# Patient Record
Sex: Male | Born: 1995 | Race: Black or African American | Hispanic: No | Marital: Single | Smoking: Never smoker
Health system: Southern US, Community
[De-identification: ages and names within clinical notes are randomized; demographics above are authoritative.]

## PROBLEM LIST (undated history)

## (undated) DIAGNOSIS — F909 Attention-deficit hyperactivity disorder, unspecified type: Secondary | ICD-10-CM

## (undated) DIAGNOSIS — S6290XA Unspecified fracture of unspecified wrist and hand, initial encounter for closed fracture: Secondary | ICD-10-CM

---
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
## 2018-03-29 ENCOUNTER — Other Ambulatory Visit: Payer: Self-pay

## 2018-03-29 ENCOUNTER — Ambulatory Visit
Admission: EM | Admit: 2018-03-29 | Discharge: 2018-03-29 | Disposition: A | Discharge status: Home / Self Care | Payer: Self-pay | Attending: Emergency Medicine | Admitting: Emergency Medicine

## 2018-03-29 ENCOUNTER — Encounter: Payer: Self-pay | Admitting: Emergency Medicine

## 2018-03-29 DIAGNOSIS — J029 Acute pharyngitis, unspecified: Secondary | ICD-10-CM

## 2018-03-29 LAB — RAPID STREP SCREEN (MED CTR MEBANE ONLY): STREPTOCOCCUS, GROUP A SCREEN (DIRECT): NEGATIVE

## 2018-03-29 LAB — MONONUCLEOSIS SCREEN: MONO SCREEN: NEGATIVE

## 2018-03-29 MED ORDER — IBUPROFEN 600 MG PO TABS: 600.0000 mg | ORAL_TABLET | Freq: Four times a day (QID) | ORAL | 0 refills | Status: DC | PRN

## 2018-03-29 MED ORDER — DEXAMETHASONE SODIUM PHOSPHATE 10 MG/ML IJ SOLN
10.0000 mg | Freq: Once | INTRAMUSCULAR | Status: AC
  Administered 2018-03-29: 10 mg via INTRAMUSCULAR

## 2018-03-29 NOTE — ED Triage Notes (Signed)
Patient c/o sore throat x 1 week. 

## 2018-03-29 NOTE — ED Provider Notes (Signed)
HPI  SUBJECTIVE:  Patient reports sore throat starting a week ago. Sx worse in the morning and after  swallowing.  Sx better with nothing. Has been taking throat spray, salt water gargles, rest, Vicks VapoRub w/ o relief.  No Documented fevers, has not been checking. + Swollen neck glands   No neck stiffness  No Cough/URI sxs No Myalgias No Headache No Rash     No Recent Strep, mono exposure, no sick contacts with similar symptoms. No Abdominal Pain No reflux sxs No Allergy sxs   No Breathing difficulty, voice changes,  + sensation of throat swelling shut No Drooling No Trismus No abx in past month.  No antipyretic in past 4-6 h Past medical history of GERD.  No history of recurrent strep, mono, diabetes.   PMD: None.  Past Medical History:  Diagnosis Date  . ADHD (attention deficit hyperactivity disorder)   . Fracture of hand 2011    Past Surgical History:  Procedure Laterality Date  . CLOSED REDUCTION METACARPAL WITH PERCUTANEOUS PINNING Right 09/16/2015   Procedure: CLOSED REDUCTION METACARPAL WITH PERCUTANEOUS PINNING;  Surgeon: Christena Flake, MD;  Location: [facility] ORS;  Service: Orthopedics;  Laterality: Right;    Family History  Problem Relation Age of Onset  . Hypertension Mother   . Diabetes Mother   . Healthy Father     Social History   Tobacco Use  . Smoking status: Never Smoker  . Smokeless tobacco: Never Used  Substance Use Topics  . Alcohol use: No  . Drug use: Yes    Types: Marijuana    Comment: 2 weeks ago    No current facility-administered medications for this encounter.   Current Outpatient Medications:  .  ibuprofen (ADVIL,MOTRIN) 600 MG tablet, Take 1 tablet (600 mg total) by mouth every 6 (six) hours as needed., Disp: 30 tablet, Rfl: 0  No Known Allergies   ROS  As noted in HPI.   Physical Exam  BP 115/78 (BP Location: Left Arm)   Pulse 88   Temp 99.3 F (37.4 C) (Oral)   Resp 18   Ht 5\' 9"  (1.753 m)   Wt 175 lb (79.4  kg)   SpO2 97%   BMI 25.84 kg/m   Constitutional: Well developed, well nourished, no acute distress Eyes:  EOMI, conjunctiva normal bilaterally HENT: Normocephalic, atraumatic,mucus membranes moist. + mild nasal congestion +  erythematous oropharynx + enlarged tonsils + exudates. Uvula midline.  Respiratory: Normal inspiratory effort Cardiovascular: Normal rate, no murmurs, rubs, gallops GI: nondistended, nontender. No appreciable splenomegaly skin: No rash, skin intact Lymph: + Anterior cervical LN.  No posterior cervical lymphadenopathy Musculoskeletal: no deformities Neurologic: Alert & oriented x 3, no focal neuro deficits Psychiatric: Speech and behavior appropriate.   ED Course   Medications  dexamethasone (DECADRON) injection 10 mg (10 mg Intramuscular Given 03/29/18 1843)    Orders Placed This Encounter  Procedures  . Rapid Strep Screen ([facility] [place] ONLY)    Standing Status:   Standing    Number of Occurrences:   1    Order Specific Question:   Patient immune status    Answer:   Normal  . Culture, group A strep    Standing Status:   Standing    Number of Occurrences:   1  . Mononucleosis screen    Standing Status:   Standing    Number of Occurrences:   1    Results for orders placed or performed during the hospital encounter of 03/29/18 (  from the past 24 hour(s))  Rapid Strep Screen ([facility] [place] ONLY)     Status: None   Collection Time: 03/29/18  6:23 PM  Result Value Ref Range   Streptococcus, Group A Screen (Direct) NEGATIVE NEGATIVE  Mononucleosis screen     Status: None   Collection Time: 03/29/18  6:43 PM  Result Value Ref Range   Mono Screen NEGATIVE NEGATIVE   No results found.  ED Clinical Impression  Exudative pharyngitis   ED Assessment/Plan  Rapid strep, mono negative. Obtaining throat culture to guide antibiotic treatment.  Presentation most consistent with a viral pharyngitis.  Discussed this with patient. We'll contact them if  culture is positive, and will call in Appropriate antibiotics. Patient home with ibuprofen, Tylenol, Benadryl/Maalox mixture. Patient to followup with PMD of choice when necessary, will refer to local primary care resources.   Discussed labs,  MDM, plan and followup with patient. Discussed sn/sx that should prompt return to the ED. patient agrees with plan.   Meds ordered this encounter  Medications  . dexamethasone (DECADRON) injection 10 mg  . ibuprofen (ADVIL,MOTRIN) 600 MG tablet    Sig: Take 1 tablet (600 mg total) by mouth every 6 (six) hours as needed.    Dispense:  30 tablet    Refill:  0     *This clinic note was created using Scientist, clinical (histocompatibility and immunogenetics)Dragon dictation software. Therefore, there may be occasional mistakes despite careful proofreading.    Domenick GongMortenson, Caliph Borowiak, MD 03/29/18 [phone]

## 2018-03-29 NOTE — Discharge Instructions (Addendum)
your rapid strep was negative today, so we have sent off a throat culture.  We will contact you and call in the appropriate antibiotics if your culture comes back positive for an infection requiring antibiotic treatment.  Give us a working phone number. 1 gram of Tylenol and 600 mg ibuprofen together 3-4 times a day as needed for pain.  Make sure you drink plenty of extra fluids.  Some people find salt water gargles and  Traditional Medicinal's "Throat Coat" tea helpful. Take 5 mL of liquid Benadryl and 5 mL of Maalox. Mix it together, and then hold it in your mouth for as long as you can and then swallow. You may do this 4 times a day.   Here is a list of primary care providers who are taking new patients:  Dr. Elizabeth Sauereanna Jones, Dr. Schuyler AmorWilliam Plonk [address] [place] [address] [phone]  [facility] [place] [address]  [place] [address]  [phone]  [facility] [address]  [place], [place] [address] [phone]  [facility] [place] [address]  [phone] [place], [place] [address]  Here are clinics/ other resources who will see you if you do not have insurance. Some have certain criteria that you must meet. Call them and find out what they are:  [facility]: [address] [place] [address] [place], [place] [address] Phone: [phone] Hours: First and Third Saturdays of each Month, 9 a.m. - 1 p.m.  [facility]: [address], [place], [place] [address] Phone: [phone] Hours: Tuesday, 4 p.m. - 8 p.m. Thursday, 1 p.m. - 8 p.m. Wednesday, 9 a.m. - [facility]  [facility] [address], [place], [place] [address] Phone: [phone] Pharmacy Phone Number: [phone] Dental Phone Number: [phone] [facility] Insurance Help: [phone]  Dental Hours: Monday - Thursday, 8 a.m. - 6 p.m.  [facility] [address] [place], [place] [address] Phone: [phone] Pharmacy  Phone Number: [phone] [facility] Insurance Help: [phone]  [facility] [address] [place], [place] [address] Phone: [phone] Pharmacy Phone Number: [phone] [facility] Insurance Help: [phone]  [facility] [address] [place], [place] [address] Phone: [phone] [facility] Insurance Help: [phone]   [facility] [address] [place], [place] [address] Phone: [phone]  Go to www.goodrx.com to look up your medications. This will give you a list of where you can find your prescriptions at the most affordable prices. Or ask the pharmacist what the cash price is, or if they have any other discount programs available to help make your medication more affordable. This can be less expensive than what you would pay with insurance.

## 2018-04-01 LAB — CULTURE, GROUP A STREP (THRC)

## 2018-09-20 ENCOUNTER — Other Ambulatory Visit: Payer: Self-pay

## 2018-09-20 ENCOUNTER — Emergency Department: Payer: Self-pay

## 2018-09-20 ENCOUNTER — Emergency Department
Admission: EM | Admit: 2018-09-20 | Discharge: 2018-09-20 | Disposition: A | Discharge status: Home / Self Care | Payer: Self-pay | Attending: Emergency Medicine | Admitting: Emergency Medicine

## 2018-09-20 DIAGNOSIS — R55 Syncope and collapse: Secondary | ICD-10-CM | POA: Insufficient documentation

## 2018-09-20 LAB — CBC WITH DIFFERENTIAL/PLATELET
Abs Immature Granulocytes: 0.1 10*3/uL — ABNORMAL HIGH (ref 0.00–0.07)
BASOS ABS: 0 10*3/uL (ref 0.0–0.1)
BASOS PCT: 0 %
EOS PCT: 1 %
Eosinophils Absolute: 0.1 10*3/uL (ref 0.0–0.5)
HCT: 46.3 % (ref 39.0–52.0)
Hemoglobin: 14.5 g/dL (ref 13.0–17.0)
Immature Granulocytes: 1 %
Lymphocytes Relative: 35 %
Lymphs Abs: 3.6 10*3/uL (ref 0.7–4.0)
MCH: 29.2 pg (ref 26.0–34.0)
MCHC: 31.3 g/dL (ref 30.0–36.0)
MCV: 93.3 fL (ref 80.0–100.0)
MONO ABS: 0.4 10*3/uL (ref 0.1–1.0)
Monocytes Relative: 4 %
NEUTROS ABS: 6.2 10*3/uL (ref 1.7–7.7)
NRBC: 0 % (ref 0.0–0.2)
Neutrophils Relative %: 59 %
PLATELETS: 200 10*3/uL (ref 150–400)
RBC: 4.96 MIL/uL (ref 4.22–5.81)
RDW: 13.6 % (ref 11.5–15.5)
WBC: 10.4 10*3/uL (ref 4.0–10.5)

## 2018-09-20 LAB — COMPREHENSIVE METABOLIC PANEL
ALBUMIN: 5.2 g/dL — AB (ref 3.5–5.0)
ALT: 17 U/L (ref 0–44)
ANION GAP: 27 — AB (ref 5–15)
AST: 45 U/L — ABNORMAL HIGH (ref 15–41)
Alkaline Phosphatase: 84 U/L (ref 38–126)
BUN: 15 mg/dL (ref 6–20)
CHLORIDE: 107 mmol/L (ref 98–111)
CO2: 9 mmol/L — AB (ref 22–32)
Calcium: 9.6 mg/dL (ref 8.9–10.3)
Creatinine, Ser: 1.6 mg/dL — ABNORMAL HIGH (ref 0.61–1.24)
GFR calc Af Amer: >60 mL/min (ref >60)
GFR calc non Af Amer: >60 mL/min (ref >60)
GLUCOSE: 148 mg/dL — AB (ref 70–99)
POTASSIUM: 4.3 mmol/L (ref 3.5–5.1)
SODIUM: 143 mmol/L (ref 135–145)
Total Bilirubin: 0.8 mg/dL (ref 0.3–1.2)
Total Protein: 8.8 g/dL — ABNORMAL HIGH (ref 6.5–8.1)

## 2018-09-20 LAB — ETHANOL: Alcohol, Ethyl (B): <10 mg/dL (ref <10)

## 2018-09-20 LAB — SALICYLATE LEVEL: Salicylate Lvl: <7 mg/dL (ref 2.8–30.0)

## 2018-09-20 LAB — LIPASE, BLOOD: LIPASE: 24 U/L (ref 11–51)

## 2018-09-20 LAB — ACETAMINOPHEN LEVEL

## 2018-09-20 MED ORDER — KETOROLAC TROMETHAMINE 30 MG/ML IJ SOLN
30.0000 mg | Freq: Once | INTRAMUSCULAR | Status: AC
  Administered 2018-09-20: 30 mg via INTRAVENOUS
  Filled 2018-09-20: qty 1

## 2018-09-20 MED ORDER — SODIUM CHLORIDE 0.9 % IV BOLUS
500.0000 mL | Freq: Once | INTRAVENOUS | Status: AC
  Administered 2018-09-20: 16:00:00 via INTRAVENOUS
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
## 2018-09-20 NOTE — Discharge Instructions (Signed)
Return to the ER for new, worsening, persistent pain, weakness or lightheadedness, difficulty breathing, or any other new or worsening symptoms that concern you.

## 2018-09-20 NOTE — ED Notes (Signed)
Joshua Fuentes called this RN. She stated that she is mother, pt doesn't know about any legal guardianship.

## 2018-09-20 NOTE — ED Notes (Signed)
Pt DC in police custody and handcuffs. Unable to sign for DC. Given DC instructions.

## 2018-09-20 NOTE — ED Notes (Signed)
Attempted to call mother. Pt reports she is his legal guardian. Called [phone] and number was busy. Will attempt again.

## 2018-09-20 NOTE — ED Notes (Signed)
Pt talking to mom on the phone.  
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
## 2019-03-06 ENCOUNTER — Encounter: Payer: Self-pay | Admitting: Physician Assistant

## 2019-03-06 ENCOUNTER — Other Ambulatory Visit: Payer: Self-pay

## 2019-03-06 ENCOUNTER — Ambulatory Visit: Payer: Self-pay | Admitting: Physician Assistant

## 2019-03-06 DIAGNOSIS — A5401 Gonococcal cystitis and urethritis, unspecified: Secondary | ICD-10-CM

## 2019-03-06 DIAGNOSIS — Z113 Encounter for screening for infections with a predominantly sexual mode of transmission: Secondary | ICD-10-CM

## 2019-03-06 LAB — GRAM STAIN

## 2019-03-06 MED ORDER — AZITHROMYCIN 500 MG PO TABS
500.0000 mg | ORAL_TABLET | Freq: Once | ORAL | Status: AC
  Administered 2019-03-06: 1000 mg via ORAL

## 2019-03-06 MED ORDER — CEFTRIAXONE SODIUM 250 MG IJ SOLR
250.0000 mg | Freq: Once | INTRAMUSCULAR | Status: AC
  Administered 2019-03-06: 250 mg via INTRAMUSCULAR

## 2019-03-06 NOTE — Progress Notes (Signed)
Gram stain reviewed, pt treated for GC per provider and standing orders. Provider orders completed.

## 2019-03-06 NOTE — Progress Notes (Signed)
    STI clinic/screening visit  Subjective:  Joshua Fuentes is a 23 y.o. male being seen today for an STI screening visit. The patient reports they do have symptoms.  Patient has the following medical conditionsdoes not have a problem list on file.  Chief Complaint  Patient presents with  . SEXUALLY TRANSMITTED DISEASE    Patient reports that he has had a yellow discharge and slight dysuria for 1 week.  Denies other symptoms or concerns.  HPI   See flowsheet for further details and programmatic requirements.    The following portions of the patient's history were reviewed and updated as appropriate: allergies, current medications, past family history, past medical history, past social history, past surgical history and problem list. Problem list updated.  Objective:  There were no vitals filed for this visit.  Physical Exam Constitutional:      General: He is not in acute distress.    Appearance: Normal appearance.  HENT:     Head: Normocephalic and atraumatic.     Mouth/Throat:     Mouth: Mucous membranes are moist.     Pharynx: Oropharynx is clear. No oropharyngeal exudate or posterior oropharyngeal erythema.  Neck:     Musculoskeletal: Neck supple.  Pulmonary:     Effort: Pulmonary effort is normal.  Abdominal:     Palpations: Abdomen is soft. There is no mass.     Tenderness: There is no abdominal tenderness. There is no guarding or rebound.  Genitourinary:    Scrotum/Testes: Normal.     Comments: Pubic area without nits, lice, edema, erythema, or lesions. Shotty inguinal adenopathy bilaterally Penis without edema, erythema, or lesions. Thick, yellow penile discharge  Lymphadenopathy:     Cervical: No cervical adenopathy.  Skin:    General: Skin is warm and dry.     Findings: No bruising or rash.  Neurological:     Mental Status: He is alert and oriented to person, place, and time.  Psychiatric:        Mood and Affect: Mood normal.        Behavior:  Behavior normal.       Assessment and Plan:  Joshua Fuentes is a 23 y.o. male presenting to the [facility] Department for STI screening  1. Screening for STD (sexually transmitted disease) Patient is with penile discharge and requests screening today. Rec condoms with all sex Await test results.  Counseled that RN will call patient if he needs to RTC for further treatment after results are back. - Gram stain - HIV [facility] - Syphilis Serology, [place]  2. Gonococcal urethritis in male Treat for GC with Ceftriaxone 250mg  IM and Azithromycin 1g po DOT today No sex for 7 days and until after partner completes treatment Rec condoms with all sex RTC if needs to be retreated and prn. - Gram stain - azithromycin (ZITHROMAX) tablet 500 mg - cefTRIAXone (ROCEPHIN) injection 250 mg     Return in about 3 months (around 06/06/2019) for TOC.  No future appointments.  Jerene Dilling, PA

## 2019-07-30 ENCOUNTER — Other Ambulatory Visit: Payer: Self-pay

## 2019-07-30 DIAGNOSIS — Z20822 Contact with and (suspected) exposure to covid-19: Secondary | ICD-10-CM

## 2019-07-31 LAB — NOVEL CORONAVIRUS, NAA: SARS-CoV-2, NAA: DETECTED — AB

## 2019-11-01 ENCOUNTER — Ambulatory Visit: Payer: Self-pay

## 2019-11-02 ENCOUNTER — Ambulatory Visit: Payer: Self-pay

## 2020-01-26 ENCOUNTER — Other Ambulatory Visit: Payer: Self-pay

## 2020-01-26 ENCOUNTER — Ambulatory Visit
Admission: EM | Admit: 2020-01-26 | Discharge: 2020-01-26 | Disposition: A | Discharge status: Home / Self Care | Payer: Self-pay | Attending: Family Medicine | Admitting: Family Medicine

## 2020-01-26 ENCOUNTER — Ambulatory Visit (INDEPENDENT_AMBULATORY_CARE_PROVIDER_SITE_OTHER): Payer: Self-pay

## 2020-01-26 DIAGNOSIS — S86001A Unspecified injury of right Achilles tendon, initial encounter: Secondary | ICD-10-CM

## 2020-01-26 DIAGNOSIS — M25562 Pain in left knee: Secondary | ICD-10-CM

## 2020-01-26 DIAGNOSIS — M25561 Pain in right knee: Secondary | ICD-10-CM

## 2020-01-26 DIAGNOSIS — Z23 Encounter for immunization: Secondary | ICD-10-CM

## 2020-01-26 DIAGNOSIS — T07XXXA Unspecified multiple injuries, initial encounter: Secondary | ICD-10-CM

## 2020-01-26 DIAGNOSIS — M79604 Pain in right leg: Secondary | ICD-10-CM

## 2020-01-26 DIAGNOSIS — M25511 Pain in right shoulder: Secondary | ICD-10-CM

## 2020-01-26 MED ORDER — MUPIROCIN 2 % EX OINT: TOPICAL_OINTMENT | CUTANEOUS | 0 refills | Status: DC

## 2020-01-26 MED ORDER — TETANUS-DIPHTH-ACELL PERTUSSIS 5-2.5-18.5 LF-MCG/0.5 IM SUSP
0.5000 mL | Freq: Once | INTRAMUSCULAR | Status: AC
  Administered 2020-01-26: 0.5 mL via INTRAMUSCULAR

## 2020-01-26 MED ORDER — TRAMADOL HCL 50 MG PO TABS: 50.0000 mg | ORAL_TABLET | Freq: Three times a day (TID) | ORAL | 0 refills | Status: DC | PRN

## 2020-01-26 MED ORDER — CEPHALEXIN 500 MG PO CAPS: 500.0000 mg | ORAL_CAPSULE | Freq: Four times a day (QID) | ORAL | 0 refills | Status: AC

## 2020-01-26 MED ORDER — MELOXICAM 15 MG PO TABS: 15.0000 mg | ORAL_TABLET | Freq: Every day | ORAL | 0 refills | Status: DC | PRN
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
## 2020-01-26 NOTE — Discharge Instructions (Signed)
Take medication as prescribed. Rest. Drink plenty of fluids. Ice. Use crutches. Avoid weightbearing on right leg.   Follow-up with orthopedic this week.  This is very important.  Keep wounds clean.  Monitor.  Follow up with your primary care physician this week. Return to Urgent care or ER for new or worsening concerns.
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
## 2020-01-26 NOTE — ED Notes (Signed)
Removed multiple stuck bandages from patients wounds. Wounds to bilateral leg, thighs and knees. Placed in blue paper shorts and cleaned with wound cleaner. Patient tolerated well.

## 2020-06-03 ENCOUNTER — Encounter: Payer: Self-pay | Admitting: Family Medicine

## 2020-06-03 ENCOUNTER — Ambulatory Visit: Payer: Self-pay | Admitting: Family Medicine

## 2020-06-03 ENCOUNTER — Other Ambulatory Visit: Payer: Self-pay

## 2020-06-03 DIAGNOSIS — Z202 Contact with and (suspected) exposure to infections with a predominantly sexual mode of transmission: Secondary | ICD-10-CM

## 2020-06-03 DIAGNOSIS — Z113 Encounter for screening for infections with a predominantly sexual mode of transmission: Secondary | ICD-10-CM

## 2020-06-03 LAB — GRAM STAIN

## 2020-06-03 MED ORDER — AZITHROMYCIN 500 MG PO TABS
1000.0000 mg | ORAL_TABLET | Freq: Once | ORAL | Status: AC
  Administered 2020-06-03: 1000 mg via ORAL

## 2020-06-03 NOTE — Progress Notes (Signed)
Gram stain reviewed and is negative today. Pt treated as a contact to Chlamydia per Larene Pickett, FNP order. Provider orders completed.

## 2020-06-03 NOTE — Progress Notes (Signed)
   [facility] Department STI clinic/screening visit  Subjective:  Joshua Fuentes is a 24 y.o. male being seen today for an STI screening visit. The patient reports they do have symptoms.    Patient has the following medical conditions:  There are no problems to display for this patient.    Chief Complaint  Patient presents with  . SEXUALLY TRANSMITTED DISEASE    HPI  Patient reports he is here today because his partner was treated for chlamydia.  He states that he has a small amount of white discharge and no other symptoms.   See flowsheet for further details and programmatic requirements.    The following portions of the patient's history were reviewed and updated as appropriate: allergies, current medications, past medical history, past social history, past surgical history and problem list.  Objective:  There were no vitals filed for this visit.  Physical Exam Constitutional:      Appearance: Normal appearance.  HENT:     Head: Normocephalic and atraumatic.     Comments: No nits or hair loss    Mouth/Throat:     Mouth: Mucous membranes are moist.     Pharynx: Oropharynx is clear. No oropharyngeal exudate or posterior oropharyngeal erythema.  Pulmonary:     Effort: Pulmonary effort is normal.  Abdominal:     General: Abdomen is flat.     Palpations: Abdomen is soft. There is no hepatomegaly or mass.     Tenderness: There is no abdominal tenderness.  Genitourinary:    Pubic Area: No rash or pubic lice.      Penis: Normal. No discharge or lesions.      Testes: Normal.     Epididymis:     Right: Normal.     Left: Normal.  Lymphadenopathy:     Head:     Right side of head: No preauricular or posterior auricular adenopathy.     Left side of head: No preauricular or posterior auricular adenopathy.     Cervical: No cervical adenopathy.     Upper Body:     Right upper body: No supraclavicular or axillary adenopathy.     Left upper body: No  supraclavicular or axillary adenopathy.     Lower Body: No right inguinal adenopathy. No left inguinal adenopathy.  Skin:    General: Skin is warm and dry.     Findings: No rash.  Neurological:     Mental Status: He is alert and oriented to person, place, and time.     Assessment and Plan:  Joshua Fuentes is a 24 y.o. male presenting to the [facility] Department for STI screening  1. Screening examination for venereal disease  - Gram stain - Gonococcus culture - HIV [facility] - Syphilis Serology, [facility]  2. Chlamydia contact  - azithromycin (ZITHROMAX) tablet 1,000 mg Co to abstain from sex x 1 week. Co to use condoms always for STD prevention    Return if symptoms worsen or fail to improve.  No future appointments.  Larene Pickett, FNP

## 2020-06-08 LAB — GONOCOCCUS CULTURE

## 2020-10-22 ENCOUNTER — Other Ambulatory Visit: Payer: Self-pay

## 2020-10-22 ENCOUNTER — Ambulatory Visit: Payer: Self-pay

## 2020-10-22 ENCOUNTER — Ambulatory Visit: Payer: Self-pay | Admitting: Physician Assistant

## 2020-10-22 DIAGNOSIS — Z113 Encounter for screening for infections with a predominantly sexual mode of transmission: Secondary | ICD-10-CM

## 2020-10-22 DIAGNOSIS — A5401 Gonococcal cystitis and urethritis, unspecified: Secondary | ICD-10-CM

## 2020-10-22 LAB — GRAM STAIN

## 2020-10-22 MED ORDER — CEFTRIAXONE SODIUM 500 MG IJ SOLR
500.0000 mg | Freq: Once | INTRAMUSCULAR | Status: AC
  Administered 2020-10-22: 500 mg via INTRAMUSCULAR

## 2020-10-22 MED ORDER — DOXYCYCLINE HYCLATE 100 MG PO TABS: 100.0000 mg | ORAL_TABLET | Freq: Two times a day (BID) | ORAL | 0 refills | Status: AC

## 2020-10-23 ENCOUNTER — Encounter: Payer: Self-pay | Admitting: Physician Assistant

## 2020-10-23 NOTE — Progress Notes (Signed)
[facility] Department STI clinic/screening visit  Subjective:  Joshua Fuentes is a 25 y.o. male being seen today for an STI screening visit. The patient reports they do have symptoms.    Patient has the following medical conditions:  There are no problems to display for this patient.    Chief Complaint  Patient presents with  . SEXUALLY TRANSMITTED DISEASE    screening    HPI  Patient reports that he has had yellow discharge and dysuria for 1 week.  Denies other symptoms,chronic conditions, surgeries and regular medicines.  States last HIV test was 2 months ago and last void prior to sample collection for Gram stain was over 2 hr ago.   See flowsheet for further details and programmatic requirements.    The following portions of the patient's history were reviewed and updated as appropriate: allergies, current medications, past medical history, past social history, past surgical history and problem list.  Objective:  There were no vitals filed for this visit.  Physical Exam Constitutional:      General: He is not in acute distress.    Appearance: Normal appearance.  HENT:     Head: Normocephalic and atraumatic.     Comments: No nits,lice, or hair loss. No cervical, supraclavicular or axillary adenopathy.    Mouth/Throat:     Mouth: Mucous membranes are moist.     Pharynx: Oropharynx is clear. No oropharyngeal exudate or posterior oropharyngeal erythema.  Eyes:     Conjunctiva/sclera: Conjunctivae normal.  Pulmonary:     Effort: Pulmonary effort is normal.  Abdominal:     Palpations: Abdomen is soft. There is no mass.     Tenderness: There is no abdominal tenderness. There is no guarding or rebound.  Genitourinary:    Penis: Normal.      Testes: Normal.     Comments: Pubic area without nits, lice, hair loss, edema, erythema, lesions and inguinal adenopathy. Penis circumcised without rash or lesions. Moderate amount of yellow discharge at  meatus. Musculoskeletal:     Cervical back: Neck supple. No tenderness.  Skin:    General: Skin is warm and dry.     Findings: No bruising, erythema, lesion or rash.  Neurological:     Mental Status: He is alert and oriented to person, place, and time.  Psychiatric:        Mood and Affect: Mood normal.        Behavior: Behavior normal.        Thought Content: Thought content normal.        Judgment: Judgment normal.       Assessment and Plan:  Joshua Fuentes is a 25 y.o. male presenting to the [facility] Department for STI screening  1. Screening for STD (sexually transmitted disease) Patient into clinic with symptoms. Rec condoms with all sex. Await test results.  Counseled that RN will call if needs to RTC for treatment once results are back. - Gram stain - HIV [place] [facility] - Syphilis Serology, [facility]  2. Gonococcal urethritis in male Will treat for GC and cover for Chlamydia with Ceftriaxone 500 mg IM and Doxycycline 100 mg #14 1 po BID for 7 days. No sex for 14 days and until after partner completes treatment. Call with questions or concerns. Patient observed for 15 min after injection and released without incident. - cefTRIAXone (ROCEPHIN) injection 500 mg - doxycycline (VIBRA-TABS) 100 MG tablet; Take 1 tablet (100 mg total) by mouth 2 (  two) times daily for 7 days.  Dispense: 14 tablet; Refill: 0     No follow-ups on file.  No future appointments.  Matt Holmes, PA

## 2020-10-24 NOTE — Progress Notes (Signed)
Chart reviewed by Pharmacist  Suzanne Walker PharmD, Contract Pharmacist at [facility]  

## 2021-01-25 ENCOUNTER — Other Ambulatory Visit: Payer: Self-pay

## 2021-01-25 ENCOUNTER — Emergency Department
Admission: EM | Admit: 2021-01-25 | Discharge: 2021-01-25 | Disposition: A | Discharge status: Home / Self Care | Payer: Self-pay | Attending: Emergency Medicine | Admitting: Emergency Medicine

## 2021-01-25 DIAGNOSIS — W34010A Accidental discharge of airgun, initial encounter: Secondary | ICD-10-CM | POA: Insufficient documentation

## 2021-01-25 DIAGNOSIS — S0501XA Injury of conjunctiva and corneal abrasion without foreign body, right eye, initial encounter: Secondary | ICD-10-CM | POA: Insufficient documentation

## 2021-01-25 MED ORDER — FLUORESCEIN SODIUM 1 MG OP STRP
1.0000 | ORAL_STRIP | Freq: Once | OPHTHALMIC | Status: AC
  Administered 2021-01-25: 1 via OPHTHALMIC
  Filled 2021-01-25: qty 1

## 2021-01-25 MED ORDER — ERYTHROMYCIN 5 MG/GM OP OINT
TOPICAL_OINTMENT | Freq: Once | OPHTHALMIC | Status: AC
  Administered 2021-01-25: 1 via OPHTHALMIC
  Filled 2021-01-25: qty 1

## 2021-01-25 MED ORDER — ERYTHROMYCIN 5 MG/GM OP OINT: 1.0000 "application " | TOPICAL_OINTMENT | Freq: Every day | OPHTHALMIC | 0 refills | Status: AC

## 2021-01-25 MED ORDER — CIPROFLOXACIN HCL 0.3 % OP SOLN
2.0000 [drp] | Freq: Four times a day (QID) | OPHTHALMIC | Status: DC
  Administered 2021-01-25: 2 [drp] via OPHTHALMIC
  Filled 2021-01-25: qty 2.5

## 2021-01-25 MED ORDER — TETRACAINE HCL 0.5 % OP SOLN
2.0000 [drp] | Freq: Once | OPHTHALMIC | Status: AC
  Administered 2021-01-25: 2 [drp] via OPHTHALMIC
  Filled 2021-01-25: qty 4

## 2021-01-25 MED ORDER — KETOROLAC TROMETHAMINE 0.5 % OP SOLN
1.0000 [drp] | Freq: Four times a day (QID) | OPHTHALMIC | Status: DC
  Administered 2021-01-25: 1 [drp] via OPHTHALMIC
  Filled 2021-01-25: qty 3

## 2021-01-25 MED ORDER — ONDANSETRON 4 MG PO TBDP: 4.0000 mg | ORAL_TABLET | Freq: Once | ORAL | 0 refills | Status: AC
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
## 2021-01-25 NOTE — Discharge Instructions (Addendum)
Please use ciprofloxacin eyedrops 4 times daily and ketorolac eyedrops 4 times daily along with erythromycin ointment at nighttime.  Go to Dr. Skip Estimable office tomorrow morning around 8 AM for repeat evaluation.  Return to the ER for any worsening symptoms or urgent changes in her health
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
## 2021-02-11 ENCOUNTER — Encounter: Payer: Self-pay | Admitting: Physician Assistant

## 2021-02-11 ENCOUNTER — Other Ambulatory Visit: Payer: Self-pay

## 2021-02-11 ENCOUNTER — Ambulatory Visit: Payer: Self-pay | Admitting: Physician Assistant

## 2021-02-11 DIAGNOSIS — Z202 Contact with and (suspected) exposure to infections with a predominantly sexual mode of transmission: Secondary | ICD-10-CM

## 2021-02-11 DIAGNOSIS — Z113 Encounter for screening for infections with a predominantly sexual mode of transmission: Secondary | ICD-10-CM

## 2021-02-11 LAB — GRAM STAIN

## 2021-02-11 MED ORDER — CEFTRIAXONE SODIUM 500 MG IJ SOLR
500.0000 mg | Freq: Once | INTRAMUSCULAR | Status: AC
  Administered 2021-02-11: 500 mg via INTRAMUSCULAR

## 2021-02-11 MED ORDER — DOXYCYCLINE HYCLATE 100 MG PO TABS: 100.0000 mg | ORAL_TABLET | Freq: Two times a day (BID) | ORAL | 0 refills | Status: AC

## 2021-02-11 NOTE — Progress Notes (Signed)
Pt here for STD screening and contact to Gonorrhea.  Gram stain results reviewed.  Medication dispensed per Provider orders and Ceftriaxone 500 mg give IM without any complications. Berdie Ogren, RN

## 2021-02-11 NOTE — Progress Notes (Signed)
[facility] Department STI clinic/screening visit  Subjective:  Joshua Fuentes is a 25 y.o. male being seen today for an STI screening visit. The patient reports they do not have symptoms.    Patient has the following medical conditions:  There are no problems to display for this patient.    Chief Complaint  Patient presents with   SEXUALLY TRANSMITTED DISEASE    STD screening     HPI  Patient reports that he is not having any symptoms but is a contact to GC.  Reports a history of ADHD, but is not taking any medicines for this or any other condition.  Reports last HIV test was about 2 months ago and last void prior to sample collection for Gram stain was less than 2 hr ago.   See flowsheet for further details and programmatic requirements.    The following portions of the patient's history were reviewed and updated as appropriate: allergies, current medications, past medical history, past social history, past surgical history and problem list.  Objective:  There were no vitals filed for this visit.  Physical Exam Constitutional:      General: He is not in acute distress.    Appearance: Normal appearance.  HENT:     Head: Normocephalic and atraumatic.     Comments: No nits,lice, or hair loss. No cervical, supraclavicular or axillary adenopathy.     Mouth/Throat:     Mouth: Mucous membranes are moist.     Pharynx: Oropharynx is clear. No oropharyngeal exudate or posterior oropharyngeal erythema.  Eyes:     Conjunctiva/sclera: Conjunctivae normal.  Pulmonary:     Effort: Pulmonary effort is normal.  Abdominal:     Palpations: Abdomen is soft. There is no mass.     Tenderness: There is no abdominal tenderness. There is no guarding or rebound.  Genitourinary:    Penis: Normal.      Testes: Normal.     Comments: Pubic area without nits, lice, hair loss, edema, erythema or lesions. Left inguinal area with shotty adenopathy and right side without any  inguinal adenopathy. Penis circumcised without rash, lesions and discharge at meatus. Testicles descended bilaterally,nt, no masses or edema.  Musculoskeletal:     Cervical back: Neck supple. No tenderness.  Skin:    General: Skin is warm and dry.     Findings: No bruising, erythema, lesion or rash.  Neurological:     Mental Status: He is alert and oriented to person, place, and time.  Psychiatric:        Mood and Affect: Mood normal.        Behavior: Behavior normal.        Thought Content: Thought content normal.        Judgment: Judgment normal.      Assessment and Plan:  Joshua Fuentes is a 25 y.o. male presenting to the [facility] Department for STI screening  1. Screening for STD (sexually transmitted disease) Patient into clinic without symptoms. Reviewed with patient that since he is a contact, we will go ahead and treat him today. Rec condoms with all sex. Await test results.  Counseled that RN will call if needs to RTC for treatment once results are back.  - Gram stain - Gonococcus culture - HIV [place] [facility] - Syphilis Serology, [place] [facility]  2. Gonorrhea contact Will treat as a contact to [facility] and cover for Chlamydia with Ceftriaxone 500 mg IM and Doxycycline 100 mg #14 1  po BID for 7 days. No sex for 14 days and until after partner completes treatment. Call with any questions or concerns. - cefTRIAXone (ROCEPHIN) injection 500 mg - doxycycline (VIBRA-TABS) 100 MG tablet; Take 1 tablet (100 mg total) by mouth 2 (two) times daily for 7 days.  Dispense: 14 tablet; Refill: 0     No follow-ups on file.  No future appointments.  Matt Holmes, PA

## 2021-02-12 ENCOUNTER — Telehealth: Payer: Self-pay | Admitting: Family Medicine

## 2021-02-12 NOTE — Telephone Encounter (Signed)
Patient called because he was given some pills as treatment as part of his last visit with [place] and he lost one of them. Would like to know what to do.

## 2021-02-18 LAB — GONOCOCCUS CULTURE

## 2021-02-19 LAB — HM HIV SCREENING LAB: HM HIV Screening: NEGATIVE

## 2023-02-16 ENCOUNTER — Ambulatory Visit: Payer: Self-pay | Admitting: Family Medicine

## 2023-02-16 DIAGNOSIS — Z114 Encounter for screening for human immunodeficiency virus [HIV]: Secondary | ICD-10-CM

## 2023-02-16 DIAGNOSIS — Z113 Encounter for screening for infections with a predominantly sexual mode of transmission: Secondary | ICD-10-CM

## 2023-02-16 DIAGNOSIS — Z708 Other sex counseling: Secondary | ICD-10-CM

## 2023-02-16 LAB — HM HIV SCREENING LAB: HM HIV Screening: NEGATIVE

## 2023-02-16 NOTE — Progress Notes (Signed)
[facility] Department STI clinic/screening visit  Subjective:  Joshua Fuentes is a 27 y.o. male being seen today for an [facility] The patient reports they do not have symptoms.    Patient has the following medical conditions:  There are no problems to display for this patient.   Chief Complaint  Patient presents with   SEXUALLY TRANSMITTED DISEASE    No symptoms    Last HIV test per patient/review of record was  Lab Results  Component Value Date   HMHIVSCREEN Negative - Validated 02/19/2021   No results found for: "HIV"  Does the patient or their partner desires a pregnancy in the next year? No  Screening for MPX risk: Does the patient have an unexplained rash? No Is the patient MSM? No Does the patient endorse multiple sex partners or anonymous sex partners? No Did the patient have close or sexual contact with a person diagnosed with MPX? No Has the patient traveled outside the [place] where MPX is endemic? No Is there a high clinical suspicion for MPX-- evidenced by one of the following No  -Unlikely to be chickenpox  -Lymphadenopathy  -Rash that present in same phase of evolution on any given body part   See flowsheet for further details and programmatic requirements.   Immunization History  Administered Date(s) Administered   Tdap 01/26/2020     The following portions of the patient's history were reviewed and updated as appropriate: allergies, current medications, past medical history, past social history, past surgical history and problem list.  Objective:  There were no vitals filed for this visit.  Patient seen by RN only. Self collected samples.  Assessment and Plan:  Joshua Fuentes is a 27 y.o. male presenting to the [facility] Department for STI screening in [facility].  1. Screen for sexually transmitted diseases   - Chlamydia/Gonorrhea [place] [facility] - HIV [place] [facility] - Syphilis  Serology, [place] [facility]  2. Screening for HIV (human immunodeficiency virus)   - HIV  LAB   Patient does not have STI symptoms Patient accepted all screenings including  urine GC/Chlamydia, and blood work for HIV/Syphilis. Patient meets criteria for HepB screening? No. Ordered? no Patient meets criteria for HepC screening? No. Ordered? no Recommended condom use with all sex Discussed importance of condom use for STI prevent  Treat positive test results per standing order. Discussed time line for [facility] results and that patient will be called with positive results and encouraged patient to call if he had not heard in 2 weeks Recommended repeat testing in 3 months with positive results.  No follow-ups on file.  No future appointments.  Gaspar Garbe, RN

## 2023-02-16 NOTE — Progress Notes (Signed)
Attestation of [facility]: Evaluation and management procedures were performed by the Registered Nurse under  [facility] standing orders.  RN independently saw the patient and provided screenings for them without medical exam. Patient self collected specimens. I have reviewed the RN's note and chart, and I agree with screening ordered.   Toua Stites, FNP
